# Patient Record
Sex: Male | Born: 2000 | Race: White | Hispanic: No | Marital: Single | Smoking: Never smoker
Health system: Southern US, Community
[De-identification: ages and names within clinical notes are randomized; demographics above are authoritative.]

## PROBLEM LIST (undated history)

## (undated) DIAGNOSIS — T7840XA Allergy, unspecified, initial encounter: Secondary | ICD-10-CM

## (undated) DIAGNOSIS — F419 Anxiety disorder, unspecified: Secondary | ICD-10-CM

## (undated) DIAGNOSIS — Z8659 Personal history of other mental and behavioral disorders: Secondary | ICD-10-CM

## (undated) DIAGNOSIS — F513 Sleepwalking [somnambulism]: Secondary | ICD-10-CM

## (undated) HISTORY — DX: Personal history of other mental and behavioral disorders: Z86.59

## (undated) HISTORY — DX: Allergy, unspecified, initial encounter: T78.40XA

## (undated) HISTORY — DX: Anxiety disorder, unspecified: F41.9

## (undated) HISTORY — PX: NO PAST SURGERIES: SHX2092

## (undated) HISTORY — DX: Sleepwalking (somnambulism): F51.3

---
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
## 2018-09-27 IMAGING — CR DG ABDOMEN 1V
1 series · 1 of 1 positions shown · non-contrast
Comparison: None.

CLINICAL DATA: Poor weight gain.  Underweight.

EXAM:
ABDOMEN - 1 VIEW

[t abdomen supine]
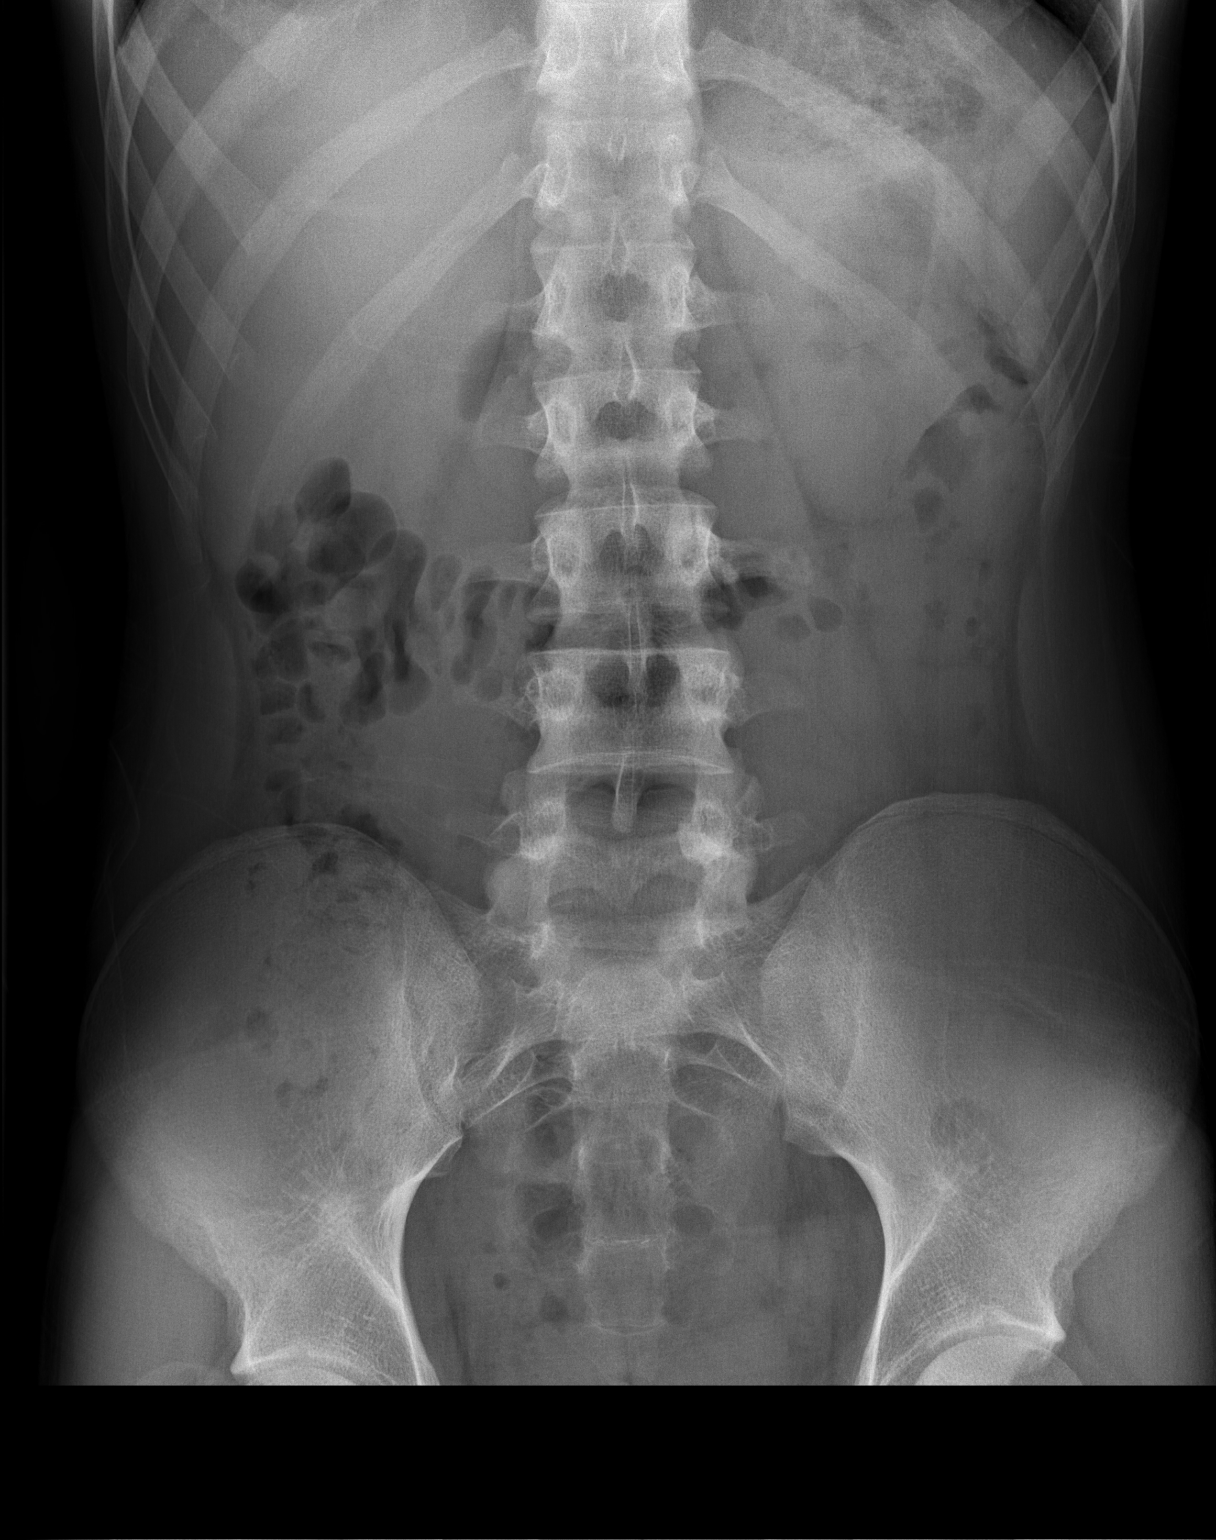

[1 of 1 positions shown; findings below may reference images not displayed]

FINDINGS: Abdominal film is unremarkable and demonstrates a normal bowel gas
pattern without evidence of dilated bowel loops, abnormal fecal
retention or abnormal appearing bowel. No abnormal calcifications or
bony abnormalities.
IMPRESSION: Normal abdominal film.

## 2018-11-07 DIAGNOSIS — Z03818 Encounter for observation for suspected exposure to other biological agents ruled out: Secondary | ICD-10-CM | POA: Diagnosis not present

## 2018-11-08 DIAGNOSIS — U071 COVID-19: Secondary | ICD-10-CM | POA: Diagnosis not present

## 2018-11-14 ENCOUNTER — Ambulatory Visit (INDEPENDENT_AMBULATORY_CARE_PROVIDER_SITE_OTHER): Payer: BC Managed Care – PPO | Admitting: Physician Assistant

## 2018-11-14 ENCOUNTER — Other Ambulatory Visit: Payer: Self-pay

## 2018-11-14 ENCOUNTER — Other Ambulatory Visit (HOSPITAL_COMMUNITY)
Admission: RE | Admit: 2018-11-14 | Discharge: 2018-11-14 | Disposition: A | Discharge status: Home / Self Care | Payer: BC Managed Care – PPO | Source: Ambulatory Visit | Attending: Physician Assistant | Admitting: Physician Assistant

## 2018-11-14 ENCOUNTER — Encounter: Payer: Self-pay | Admitting: Physician Assistant

## 2018-11-14 VITALS — BP 90/60 | HR 83 | Temp 99.8°F | Resp 16 | Ht 68.0 in | Wt 114.0 lb

## 2018-11-14 DIAGNOSIS — Z0001 Encounter for general adult medical examination with abnormal findings: Secondary | ICD-10-CM

## 2018-11-14 DIAGNOSIS — F17290 Nicotine dependence, other tobacco product, uncomplicated: Secondary | ICD-10-CM

## 2018-11-14 DIAGNOSIS — Z113 Encounter for screening for infections with a predominantly sexual mode of transmission: Secondary | ICD-10-CM | POA: Diagnosis not present

## 2018-11-14 DIAGNOSIS — N529 Male erectile dysfunction, unspecified: Secondary | ICD-10-CM

## 2018-11-14 DIAGNOSIS — Z0000 Encounter for general adult medical examination without abnormal findings: Secondary | ICD-10-CM

## 2018-11-14 LAB — POCT URINALYSIS DIPSTICK
Bilirubin, UA: NEGATIVE
Blood, UA: NEGATIVE
Glucose, UA: NEGATIVE
Ketones, UA: NEGATIVE
Leukocytes, UA: NEGATIVE
Nitrite, UA: NEGATIVE
Protein, UA: NEGATIVE
Spec Grav, UA: 1.025 (ref 1.010–1.025)
Urobilinogen, UA: 0.2 E.U./dL
pH, UA: 6 (ref 5.0–8.0)

## 2018-11-14 MED ORDER — NICOTINE 21 MG/24HR TD PT24: 21.0000 mg | MEDICATED_PATCH | Freq: Every day | TRANSDERMAL | 0 refills | Status: DC

## 2018-11-14 MED ORDER — NICOTINE 21 MG/24HR TD PT24
21.0000 mg | MEDICATED_PATCH | Freq: Every day | TRANSDERMAL | 0 refills | Status: DC
Start: 2018-11-14 — End: 2018-11-14

## 2018-11-14 NOTE — Patient Instructions (Signed)
Please schedule an appointment to return for fasting labs. Your urine dip looks good today. Will call you with the rest of results.  Start the Nicoderm patch I have sent in for the next 2 weeks. Call me in 1.5 weeks to let me know how this is going so we can determine next steps.  For the back, consider in addition to stretching, applying some icy hot or Aspercreme to the area prior to golfing to relax the muscles more and help prevent spasms. Let me know if things are not improving.     Preventive Care 9-84 Years Old, Male Preventive care refers to lifestyle choices and visits with your health care provider that can promote health and wellness. At this stage in your life, you may start seeing a primary care physician instead of a pediatrician. Your health care is now your responsibility. Preventive care for young adults includes:  A yearly physical exam. This is also called an annual wellness visit.  Regular dental and eye exams.  Immunizations.  Screening for certain conditions.  Healthy lifestyle choices, such as diet and exercise. What can I expect for my preventive care visit? Physical exam Your health care provider may check:  Height and weight. These may be used to calculate body mass index (BMI), which is a measurement that tells if you are at a healthy weight.  Heart rate and blood pressure.  Body temperature. Counseling Your health care provider may ask you questions about:  Past medical problems and family medical history.  Alcohol, tobacco, and drug use.  Home and relationship well-being.  Access to firearms.  Emotional well-being.  Diet, exercise, and sleep habits.  Sexual activity and sexual health. What immunizations do I need?  Influenza (flu) vaccine  This is recommended every year. Tetanus, diphtheria, and pertussis (Tdap) vaccine  You may need a Td booster every 10 years. Varicella (chickenpox) vaccine  You may need this vaccine if you have  not already been vaccinated. Human papillomavirus (HPV) vaccine  If recommended by your health care provider, you may need three doses over 6 months. Measles, mumps, and rubella (MMR) vaccine  You may need at least one dose of MMR. You may also need a second dose. Meningococcal conjugate (MenACWY) vaccine  One dose is recommended if you are 55-92 years old and a Market researcher living in a residence hall, or if you have one of several medical conditions. You may also need additional booster doses. Pneumococcal conjugate (PCV13) vaccine  You may need this if you have certain conditions and were not previously vaccinated. Pneumococcal polysaccharide (PPSV23) vaccine  You may need one or two doses if you smoke cigarettes or if you have certain conditions. Hepatitis A vaccine  You may need this if you have certain conditions or if you travel or work in places where you may be exposed to hepatitis A. Hepatitis B vaccine  You may need this if you have certain conditions or if you travel or work in places where you may be exposed to hepatitis B. Haemophilus influenzae type b (Hib) vaccine  You may need this if you have certain risk factors. You may receive vaccines as individual doses or as more than one vaccine together in one shot (combination vaccines). Talk with your health care provider about the risks and benefits of combination vaccines. What tests do I need? Blood tests  Lipid and cholesterol levels. These may be checked every 5 years starting at age 65.  Hepatitis C test.  Hepatitis  B test. Screening  Genital exam to check for testicular cancer or hernias.  Sexually transmitted disease (STD) testing, if you are at risk. Other tests  Tuberculosis skin test.  Vision and hearing tests.  Skin exam. Follow these instructions at home: Eating and drinking   Eat a diet that includes fresh fruits and vegetables, whole grains, lean protein, and low-fat dairy  products.  Drink enough fluid to keep your urine pale yellow.  Do not drink alcohol if: ? Your health care provider tells you not to drink. ? You are under the legal drinking age. In the [place] the legal drinking age is 59.  If you drink alcohol: ? Limit how much you have to 0-2 drinks a day. ? Be aware of how much alcohol is in your drink. In the [place] one drink equals one 12 oz bottle of beer (355 mL), one 5 oz glass of wine (148 mL), or one 1 oz glass of hard liquor (44 mL). Lifestyle  Take daily care of your teeth and gums.  Stay active. Exercise at least 30 minutes 5 or more days of the week.  Do not use any products that contain nicotine or tobacco, such as cigarettes, e-cigarettes, and chewing tobacco. If you need help quitting, ask your health care provider.  Do not use drugs.  If you are sexually active, practice safe sex. Use a condom or other form of protection to prevent STIs (sexually transmitted infections).  Find healthy ways to cope with stress, such as: ? Meditation, yoga, or listening to music. ? Journaling. ? Talking to a trusted person. ? Spending time with friends and family. Safety  Always wear your seat belt while driving or riding in a vehicle.  Do not drive if you have been drinking alcohol.  Do not ride with someone who has been drinking.  Do not drive when you are tired or distracted.  Do not text while driving.  Wear a helmet and other protective equipment during sports activities.  If you have firearms in your house, make sure you follow all gun safety procedures.  Seek help if you have been bullied, physically abused, or sexually abused.  Use the Internet responsibly to avoid dangers such as online bullying and online sex predators. What's next?  Go to your health care provider once a year for a well check visit.  Ask your health care provider how often you should have your eyes and teeth checked.  Stay up to date on all vaccines.  This information is not intended to replace advice given to you by your health care provider. Make sure you discuss any questions you have with your health care provider. Document Released: 08/08/2015 Document Revised: 03/17/2018 Document Reviewed: 03/17/2018 Elsevier Patient Education  2020 [employer].

## 2018-11-14 NOTE — Progress Notes (Signed)
Patient presents to clinic today for annual exam.  Patient is fasting for labs.  Acute Concerns: Patient requests routine STD screening today.  Is sexually active with 1 male partner (girlfriend).  Notes consistent use of condoms.  Denies any symptoms or concern.  States they both decided they should have a routine screening since that never had before.  Patient endorses a couple episodes of erectile dysfunction over the past few months.  Each episode has happened during a sexual encounter with girlfriend.  Denies painful erection or dyspareunia.  Denies change in skin of penis or abnormal curvature with erection.  States he has no issue with morning erections.  Is not sure if it is "in his head".  Denies urinary urgency, frequency, hesitancy.  Health Maintenance: Immunizations -- UTD.  Past Medical History:  Diagnosis Date  . Allergy    Seasonal  . Anxiety    with test-taking only    Past Surgical History:  Procedure Laterality Date  . NO PAST SURGERIES      No current outpatient medications on file prior to visit.   No current facility-administered medications on file prior to visit.     No Known Allergies  Family History  Problem Relation Age of Onset  . Healthy Mother        Living  . Hypertension Paternal Grandfather   . Hyperlipidemia Paternal Grandfather   . Diabetes Father        Living  . Hyperlipidemia Father   . Asthma Paternal Aunt   . Healthy Sister        x2    Social History   Socioeconomic History  . Marital status: Single    Spouse name: Not on file  . Number of children: Not on file  . Years of education: Not on file  . Highest education level: Not on file  Occupational History  . Not on file  Social Needs  . Financial resource strain: Not on file  . Food insecurity    Worry: Not on file    Inability: Not on file  . Transportation needs    Medical: Not on file    Non-medical: Not on file  Tobacco Use  . Smoking status: Never  Smoker  . Smokeless tobacco: Never Used  Substance and Sexual Activity  . Alcohol use: No    Alcohol/week: 0.0 standard drinks  . Drug use: No  . Sexual activity: Yes    Birth control/protection: Condom  Lifestyle  . Physical activity    Days per week: Not on file    Minutes per session: Not on file  . Stress: Not on file  Relationships  . Social Musicianconnections    Talks on phone: Not on file    Gets together: Not on file    Attends religious service: Not on file    Active member of club or organization: Not on file    Attends meetings of clubs or organizations: Not on file    Relationship status: Not on file  . Intimate partner violence    Fear of current or ex partner: Not on file    Emotionally abused: Not on file    Physically abused: Not on file    Forced sexual activity: Not on file  Other Topics Concern  . 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   Plays Golf.    Review of Systems  Constitutional: Negative for  fever and weight loss.  HENT: Negative for ear discharge, ear pain, hearing loss and tinnitus.   Eyes: Negative for blurred vision, double vision, photophobia and pain.  Respiratory: Negative for cough and shortness of breath.   Cardiovascular: Negative for chest pain and palpitations.  Gastrointestinal: Negative for abdominal pain, blood in stool, constipation, diarrhea, heartburn, melena, nausea and vomiting.  Genitourinary: Negative for dysuria, flank pain, frequency, hematuria and urgency.       Occasional ED when trying to have intercourse  Musculoskeletal: Negative for falls.  Neurological: Negative for dizziness, loss of consciousness and headaches.  Endo/Heme/Allergies: Negative for environmental allergies.  Psychiatric/Behavioral: Negative for depression, hallucinations, substance abuse and suicidal ideas. The patient is not nervous/anxious and does not have insomnia.    BP 90/60   Pulse 83    Temp 99.8 F (37.7 C) (Skin)   Resp 16   Ht 5\' 8"  (1.727 m)   Wt 114 lb (51.7 kg)   SpO2 99%   BMI 17.33 kg/m   Physical Exam Vitals signs reviewed.  Constitutional:      General: He is not in acute distress.    Appearance: Normal appearance. He is well-developed. He is not diaphoretic.  HENT:     Head: Normocephalic and atraumatic.     Right Ear: Tympanic membrane, ear canal and external ear normal.     Left Ear: Tympanic membrane, ear canal and external ear normal.     Nose: Nose normal.     Mouth/Throat:     Pharynx: No posterior oropharyngeal erythema.  Eyes:     Conjunctiva/sclera: Conjunctivae normal.     Pupils: Pupils are equal, round, and reactive to light.  Neck:     Musculoskeletal: Neck supple.     Thyroid: No thyromegaly.  Cardiovascular:     Rate and Rhythm: Normal rate and regular rhythm.     Heart sounds: Normal heart sounds.  Pulmonary:     Effort: Pulmonary effort is normal. No respiratory distress.     Breath sounds: Normal breath sounds. No wheezing or rales.  Chest:     Chest wall: No tenderness.  Abdominal:     General: Bowel sounds are normal. There is no distension.     Palpations: Abdomen is soft. There is no mass.     Tenderness: There is no abdominal tenderness. There is no guarding or rebound.     Hernia: There is no hernia in the left inguinal area or right inguinal area.  Genitourinary:    Penis: Normal and circumcised.      Scrotum/Testes: Normal. Cremasteric reflex is present.        Right: Mass or tenderness not present.        Left: Mass or tenderness not present.     Epididymis:     Right: Normal.     Left: Normal.  Lymphadenopathy:     Cervical: No cervical adenopathy.     Lower Body: No right inguinal adenopathy. No left inguinal adenopathy.  Skin:    General: Skin is warm and dry.     Findings: No rash.  Neurological:     Mental Status: He is alert and oriented to person, place, and time.     Cranial Nerves: No cranial  nerve deficit.    Assessment/Plan: 1. Visit for preventive health examination Depression screen negative. Health Maintenance reviewed. Preventive schedule discussed and handout given in AVS. Will obtain fasting labs today.  - CBC with Differential/Platelet; Future - Comprehensive metabolic panel; Future -  Lipid panel; Future - TSH; Future  2. Routine screening for STI (sexually transmitted infection) Asymptomatic. 1 partner - male - monogamous - consistent condom use. Exam unremarkable. Will obtain routine panel today.  - Urine cytology ancillary only([place]) - Acute Hep Panel & Hep B Surface Ab; Future - HIV Antibody (routine testing w rflx); Future - HSV(herpes smplx)abs-1+2(IgG+IgM)-bld; Future - RPR; Future  3. Other tobacco product nicotine dependence, uncomplicated Vaping for the past year.  Is ready and willing to quit.  Has tried to stop cold [place] before but noted significant anxiety and irritability.  After discussion of options we will start NicoDerm patches.  We will start him at highest dose.  Follow-up in 2 weeks via phone to make further adjustments..  - nicotine (NICODERM CQ) 21 mg/24hr patch; Place 1 patch (21 mg total) onto the skin daily.  Dispense: 14 patch; Refill: 0  4. Erectile dysfunction, unspecified erectile dysfunction type Seems more psychogenic than anything else.  Examination unremarkable.  Urine dip unremarkable.  No issue with morning erections.  Will check labs including routine STI screening today.  Discussed some stress related exercises.  He is to work on this and follow-up if things are not improving. - POCT urinalysis dipstick   Piedad ClimesWilliam Cody Renley Banwart, PA-C

## 2018-11-15 LAB — URINE CYTOLOGY ANCILLARY ONLY
Chlamydia: NEGATIVE
Neisseria Gonorrhea: NEGATIVE
Trichomonas: NEGATIVE

## 2018-11-23 ENCOUNTER — Ambulatory Visit (INDEPENDENT_AMBULATORY_CARE_PROVIDER_SITE_OTHER): Payer: BC Managed Care – PPO

## 2018-11-23 ENCOUNTER — Other Ambulatory Visit: Payer: Self-pay

## 2018-11-23 DIAGNOSIS — Z113 Encounter for screening for infections with a predominantly sexual mode of transmission: Secondary | ICD-10-CM

## 2018-11-23 DIAGNOSIS — Z0000 Encounter for general adult medical examination without abnormal findings: Secondary | ICD-10-CM

## 2018-11-23 LAB — LIPID PANEL
Cholesterol: 134 mg/dL (ref 0–200)
HDL: 37.4 mg/dL — ABNORMAL LOW (ref >39.00)
LDL Cholesterol: 74 mg/dL (ref 0–99)
NonHDL: 96.12
Total CHOL/HDL Ratio: 4
Triglycerides: 109 mg/dL (ref 0.0–149.0)
VLDL: 21.8 mg/dL (ref 0.0–40.0)

## 2018-11-23 LAB — CBC WITH DIFFERENTIAL/PLATELET
Basophils Absolute: 0 10*3/uL (ref 0.0–0.1)
Basophils Relative: 0.4 % (ref 0.0–3.0)
Eosinophils Absolute: 0.1 10*3/uL (ref 0.0–0.7)
Eosinophils Relative: 1.5 % (ref 0.0–5.0)
HCT: 40.8 % (ref 36.0–49.0)
Hemoglobin: 14.1 g/dL (ref 12.0–16.0)
Lymphocytes Relative: 38.3 % (ref 24.0–48.0)
Lymphs Abs: 2.4 10*3/uL (ref 0.7–4.0)
MCHC: 34.6 g/dL (ref 31.0–37.0)
MCV: 86.7 fl (ref 78.0–98.0)
Monocytes Absolute: 0.5 10*3/uL (ref 0.1–1.0)
Monocytes Relative: 7.5 % (ref 3.0–12.0)
Neutro Abs: 3.2 10*3/uL (ref 1.4–7.7)
Neutrophils Relative %: 52.3 % (ref 43.0–71.0)
Platelets: 244 10*3/uL (ref 150.0–575.0)
RBC: 4.7 Mil/uL (ref 3.80–5.70)
RDW: 13.3 % (ref 11.4–15.5)
WBC: 6.2 10*3/uL (ref 4.5–13.5)

## 2018-11-23 LAB — COMPREHENSIVE METABOLIC PANEL
ALT: 11 U/L (ref 0–53)
AST: 13 U/L (ref 0–37)
Albumin: 5 g/dL (ref 3.5–5.2)
Alkaline Phosphatase: 102 U/L (ref 52–171)
BUN: 11 mg/dL (ref 6–23)
CO2: 26 mEq/L (ref 19–32)
Calcium: 9.7 mg/dL (ref 8.4–10.5)
Chloride: 104 mEq/L (ref 96–112)
Creatinine, Ser: 0.95 mg/dL (ref 0.40–1.50)
GFR: 101.43 mL/min (ref >60.00)
Glucose, Bld: 80 mg/dL (ref 70–99)
Potassium: 3.7 mEq/L (ref 3.5–5.1)
Sodium: 141 mEq/L (ref 135–145)
Total Bilirubin: 0.5 mg/dL (ref 0.2–1.2)
Total Protein: 7.4 g/dL (ref 6.0–8.3)

## 2018-11-23 LAB — TSH: TSH: 2.59 u[IU]/mL (ref 0.40–5.00)

## 2018-11-24 LAB — ACUTE HEP PANEL AND HEP B SURFACE AB
HEPATITIS C ANTIBODY REFILL$(REFL): NONREACTIVE
Hep A IgM: NONREACTIVE
Hep B C IgM: NONREACTIVE
Hepatitis B Surface Ag: NONREACTIVE
SIGNAL TO CUT-OFF: 0.01 (ref <1.00)

## 2018-11-24 LAB — HIV ANTIBODY (ROUTINE TESTING W REFLEX): HIV 1&2 Ab, 4th Generation: NONREACTIVE

## 2018-11-24 LAB — REFLEX TIQ

## 2018-11-24 LAB — RPR: RPR Ser Ql: NONREACTIVE

## 2018-11-25 LAB — HSV(HERPES SMPLX)ABS-I+II(IGG+IGM)-BLD
HSV 1 Glycoprotein G Ab, IgG: <0.91 index (ref 0.00–0.90)
HSV 2 IgG, Type Spec: <0.91 index (ref 0.00–0.90)
HSVI/II Comb IgM: <0.91 Ratio (ref 0.00–0.90)

## 2019-04-05 DIAGNOSIS — Z20828 Contact with and (suspected) exposure to other viral communicable diseases: Secondary | ICD-10-CM | POA: Diagnosis not present

## 2019-04-11 DIAGNOSIS — Z20828 Contact with and (suspected) exposure to other viral communicable diseases: Secondary | ICD-10-CM | POA: Diagnosis not present

## 2019-10-31 ENCOUNTER — Telehealth: Payer: Self-pay | Admitting: Physician Assistant

## 2019-10-31 NOTE — Telephone Encounter (Signed)
Received dorm (housing) form from patient.  Put in Cody's bin up front

## 2019-10-31 NOTE — Telephone Encounter (Signed)
Reviewed forms. What housing accomodation is he requesting? I have to show medical necessity for the accomodation.

## 2019-10-31 NOTE — Telephone Encounter (Signed)
Housing Accomodation forms in your folder  LOV: 11/14/18 CPE Next OV: 11/20/19 CPE

## 2019-11-01 NOTE — Telephone Encounter (Signed)
Spoke with patient about housing accomodation forms. He states that he sleep talks and sleep walks. His freshman year he was standing over his then roommate which scared him. 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 He does not want a new roommate due to his sleep walking and talking.  Patient states he will call back with fax number for [place] to fax to.

## 2019-11-01 NOTE — Telephone Encounter (Signed)
Pt called in with the fax # the fax # is [phone]

## 2019-11-02 NOTE — Telephone Encounter (Signed)
I do not see in his chart where this has been discussed at prior visits or added to his problem list. This will need to occur before forms can be submitted. I know he has a CPE coming up. Can do at that time or if he needs done sooner, can always do a video visit to discuss this issue prior to CPE.

## 2019-11-02 NOTE — Telephone Encounter (Signed)
Patient called and said that he needs the form filled out because of his learning disability - ADD/ADHD - he said that you would have notes on this condition.

## 2019-11-03 NOTE — Telephone Encounter (Signed)
Left detailed message on VM of needing an video visit to discuss sleep walking for the reason for the housing accommodations for school. For his ADD, does he need another form completed. ADD would not classify to be a reason to have housing accommodations. But can discuss at visit sooner than CPE appointment

## 2019-11-20 ENCOUNTER — Encounter: Payer: Self-pay | Admitting: Physician Assistant

## 2019-11-20 ENCOUNTER — Ambulatory Visit (INDEPENDENT_AMBULATORY_CARE_PROVIDER_SITE_OTHER): Payer: BC Managed Care – PPO | Admitting: Physician Assistant

## 2019-11-20 ENCOUNTER — Other Ambulatory Visit: Payer: Self-pay

## 2019-11-20 VITALS — BP 100/78 | HR 77 | Temp 98.7°F | Resp 16 | Ht 68.0 in | Wt 118.0 lb

## 2019-11-20 DIAGNOSIS — Z0000 Encounter for general adult medical examination without abnormal findings: Secondary | ICD-10-CM | POA: Diagnosis not present

## 2019-11-20 DIAGNOSIS — F513 Sleepwalking [somnambulism]: Secondary | ICD-10-CM

## 2019-11-20 DIAGNOSIS — S46811A Strain of other muscles, fascia and tendons at shoulder and upper arm level, right arm, initial encounter: Secondary | ICD-10-CM

## 2019-11-20 LAB — COMPREHENSIVE METABOLIC PANEL
ALT: 10 U/L (ref 0–53)
AST: 15 U/L (ref 0–37)
Albumin: 4.9 g/dL (ref 3.5–5.2)
Alkaline Phosphatase: 93 U/L (ref 39–117)
BUN: 12 mg/dL (ref 6–23)
CO2: 28 mEq/L (ref 19–32)
Calcium: 9.8 mg/dL (ref 8.4–10.5)
Chloride: 107 mEq/L (ref 96–112)
Creatinine, Ser: 1.04 mg/dL (ref 0.40–1.50)
GFR: 90.46 mL/min (ref >60.00)
Glucose, Bld: 82 mg/dL (ref 70–99)
Potassium: 4.5 mEq/L (ref 3.5–5.1)
Sodium: 142 mEq/L (ref 135–145)
Total Bilirubin: 0.6 mg/dL (ref 0.2–1.2)
Total Protein: 7.2 g/dL (ref 6.0–8.3)

## 2019-11-20 LAB — LIPID PANEL
Cholesterol: 119 mg/dL (ref 0–200)
HDL: 36.7 mg/dL — ABNORMAL LOW (ref >39.00)
LDL Cholesterol: 70 mg/dL (ref 0–99)
NonHDL: 82.04
Total CHOL/HDL Ratio: 3
Triglycerides: 59 mg/dL (ref 0.0–149.0)
VLDL: 11.8 mg/dL (ref 0.0–40.0)

## 2019-11-20 MED ORDER — METHOCARBAMOL 500 MG PO TABS: 500.0000 mg | ORAL_TABLET | Freq: Every evening | ORAL | 0 refills | Status: DC | PRN

## 2019-11-20 NOTE — Patient Instructions (Signed)
Please go to the lab for blood work.   Our office will call you with your results unless you have chosen to receive results via MyChart.  If your blood work is normal we will follow-up each year for physicals and as scheduled for chronic medical problems.  If anything is abnormal we will treat accordingly and get you in for a follow-up.  Avoid heavy lifting and overexertion. Take the Robaxin in the evening as directed. Can alternate tylenol and ibuprofen as needed. Apply heating pad to the neck for 10-15 minutes a few times per day. Symptoms should continue resolving over the next week or so. If not, please let me know. .   Preventive Care 33-78 Years Old, Male Preventive care refers to lifestyle choices and visits with your health care provider that can promote health and wellness. At this stage in your life, you may start seeing a primary care physician instead of a pediatrician. Your health care is now your responsibility. Preventive care for young adults includes:  A yearly physical exam. This is also called an annual wellness visit.  Regular dental and eye exams.  Immunizations.  Screening for certain conditions.  Healthy lifestyle choices, such as diet and exercise. What can I expect for my preventive care visit? Physical exam Your health care provider may check:  Height and weight. These may be used to calculate body mass index (BMI), which is a measurement that tells if you are at a healthy weight.  Heart rate and blood pressure.  Body temperature. Counseling Your health care provider may ask you questions about:  Past medical problems and family medical history.  Alcohol, tobacco, and drug use.  Home and relationship well-being.  Access to firearms.  Emotional well-being.  Diet, exercise, and sleep habits.  Sexual activity and sexual health. What immunizations do I need?  Influenza (flu) vaccine  This is recommended every year. Tetanus, diphtheria,  and pertussis (Tdap) vaccine  You may need a Td booster every 10 years. Varicella (chickenpox) vaccine  You may need this vaccine if you have not already been vaccinated. Human papillomavirus (HPV) vaccine  If recommended by your health care provider, you may need three doses over 6 months. Measles, mumps, and rubella (MMR) vaccine  You may need at least one dose of MMR. You may also need a second dose. Meningococcal conjugate (MenACWY) vaccine  One dose is recommended if you are 45-27 years old and a Market researcher living in a residence hall, or if you have one of several medical conditions. You may also need additional booster doses. Pneumococcal conjugate (PCV13) vaccine  You may need this if you have certain conditions and were not previously vaccinated. Pneumococcal polysaccharide (PPSV23) vaccine  You may need one or two doses if you smoke cigarettes or if you have certain conditions. Hepatitis A vaccine  You may need this if you have certain conditions or if you travel or work in places where you may be exposed to hepatitis A. Hepatitis B vaccine  You may need this if you have certain conditions or if you travel or work in places where you may be exposed to hepatitis B. Haemophilus influenzae type b (Hib) vaccine  You may need this if you have certain risk factors. You may receive vaccines as individual doses or as more than one vaccine together in one shot (combination vaccines). Talk with your health care provider about the risks and benefits of combination vaccines. What tests do I need? Blood tests  Lipid  and cholesterol levels. These may be checked every 5 years starting at age 35.  Hepatitis C test.  Hepatitis B test. Screening  Genital exam to check for testicular cancer or hernias.  Sexually transmitted disease (STD) testing, if you are at risk. Other tests  Tuberculosis skin test.  Vision and hearing tests.  Skin exam. Follow these  instructions at home: Eating and drinking   Eat a diet that includes fresh fruits and vegetables, whole grains, lean protein, and low-fat dairy products.  Drink enough fluid to keep your urine pale yellow.  Do not drink alcohol if: ? Your health care provider tells you not to drink. ? You are under the legal drinking age. In the [place] the legal drinking age is 46.  If you drink alcohol: ? Limit how much you have to 0-2 drinks a day. ? Be aware of how much alcohol is in your drink. In the [place] one drink equals one 12 oz bottle of beer (355 mL), one 5 oz glass of wine (148 mL), or one 1 oz glass of hard liquor (44 mL). Lifestyle  Take daily care of your teeth and gums.  Stay active. Exercise at least 30 minutes 5 or more days of the week.  Do not use any products that contain nicotine or tobacco, such as cigarettes, e-cigarettes, and chewing tobacco. If you need help quitting, ask your health care provider.  Do not use drugs.  If you are sexually active, practice safe sex. Use a condom or other form of protection to prevent STIs (sexually transmitted infections).  Find healthy ways to cope with stress, such as: ? Meditation, yoga, or listening to music. ? Journaling. ? Talking to a trusted person. ? Spending time with friends and family. Safety  Always wear your seat belt while driving or riding in a vehicle.  Do not drive if you have been drinking alcohol.  Do not ride with someone who has been drinking.  Do not drive when you are tired or distracted.  Do not text while driving.  Wear a helmet and other protective equipment during sports activities.  If you have firearms in your house, make sure you follow all gun safety procedures.  Seek help if you have been bullied, physically abused, or sexually abused.  Use the Internet responsibly to avoid dangers such as online bullying and online sex predators. What's next?  Go to your health care provider once a year for  a well check visit.  Ask your health care provider how often you should have your eyes and teeth checked.  Stay up to date on all vaccines. This information is not intended to replace advice given to you by your health care provider. Make sure you discuss any questions you have with your health care provider. Document Revised: 03/17/2018 Document Reviewed: 03/17/2018 Elsevier Patient Education  2020 [employer].
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
## 2019-11-22 ENCOUNTER — Encounter: Payer: Self-pay | Admitting: Physician Assistant

## 2020-09-08 DIAGNOSIS — Z20822 Contact with and (suspected) exposure to covid-19: Secondary | ICD-10-CM | POA: Diagnosis not present

## 2020-11-08 ENCOUNTER — Telehealth: Payer: Self-pay | Admitting: Family Medicine

## 2020-11-08 NOTE — Telephone Encounter (Signed)
error 

## 2020-11-21 ENCOUNTER — Encounter: Payer: BC Managed Care – PPO | Admitting: Physician Assistant

## 2021-02-11 ENCOUNTER — Encounter: Payer: BC Managed Care – PPO | Admitting: Registered Nurse

## 2021-03-04 ENCOUNTER — Encounter: Payer: BC Managed Care – PPO | Admitting: Family Medicine

## 2021-05-15 ENCOUNTER — Other Ambulatory Visit: Payer: Self-pay

## 2021-05-16 ENCOUNTER — Ambulatory Visit (INDEPENDENT_AMBULATORY_CARE_PROVIDER_SITE_OTHER): Payer: BC Managed Care – PPO | Admitting: Family Medicine

## 2021-05-16 ENCOUNTER — Encounter: Payer: Self-pay | Admitting: Family Medicine

## 2021-05-16 VITALS — BP 110/60 | HR 85 | Temp 97.6°F | Ht 68.0 in | Wt 125.6 lb

## 2021-05-16 DIAGNOSIS — R1115 Cyclical vomiting syndrome unrelated to migraine: Secondary | ICD-10-CM

## 2021-05-16 DIAGNOSIS — M23206 Derangement of unspecified meniscus due to old tear or injury, right knee: Secondary | ICD-10-CM | POA: Diagnosis not present

## 2021-05-16 DIAGNOSIS — R636 Underweight: Secondary | ICD-10-CM | POA: Diagnosis not present

## 2021-05-16 DIAGNOSIS — S83206A Unspecified tear of unspecified meniscus, current injury, right knee, initial encounter: Secondary | ICD-10-CM | POA: Insufficient documentation

## 2021-05-16 NOTE — Progress Notes (Signed)
[facility] [address] [place] [address] Dept: [phone] Dept Fax: [phone]  Transfer of Care Office Visit  Subjective:    Patient ID: Gabriel Roberts, male    DOB: 16-Sep-2000, 21 y.o..   MRN: [id number]  Chief Complaint  Patient presents with   Establish Care    [facility]- establish care.   No concerns.  Declines flu and covid vaccines.     History of Present Illness:  Patient is in today to establish care. Mr. Slane was born in [place]. He attended [employer]. 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 He is in a committed relations hip and has no children. He vapes nicotine and occasionally uses pouch tobacco. His plan is to stop this after college. He drinks alcohol on weekends. He denies drug use.  Mr. Xu has a history of a partial tear to a mensicus in the right knee. He injured this in 2018. He notes he was working on leg pressing 200 lbs when he had a sudden pop in the knee. He has been told he may need future surgery. He notes that when he walks 18 holes of cough, he gets some knee discomfort.  Mr. Crumpacker has a history of a low body weight. He notes that he had some weight loss when over training without adequate nutrition in his high school days. he has worked to get his weight back up and is pleased to see this at 125 lbs.  Past Medical History: Patient Active Problem List   Diagnosis Date Noted   Right knee meniscal tear 05/16/2021   Underweight 11/04/2016   Non-intractable cyclical vomiting with nausea 11/04/2016   Past Surgical History:  Procedure Laterality Date   NO PAST SURGERIES     Family History  Problem Relation Age of Onset   Healthy Mother        Living   Diabetes Father        Living   Hyperlipidemia Father    Healthy Sister         x2   Asthma Paternal Aunt    Cancer Maternal Grandfather    COPD Paternal Grandfather    Hypertension Paternal Grandfather    Hyperlipidemia Paternal Grandfather    Outpatient Medications Prior to Visit  Medication Sig Dispense Refill   methocarbamol (ROBAXIN) 500 MG tablet Take 1 tablet (500 mg total) by mouth at bedtime as needed for muscle spasms. 15 tablet 0   No facility-administered medications prior to visit.   No Known Allergies    Objective:   Today's Vitals   05/16/21 1439  BP: 110/60  Pulse: 85  Temp: 97.6 F (36.4 C)  TempSrc: Temporal  SpO2: 98%  Weight: 125 lb 9.6 oz (57 kg)  Height: 5\' 8"  (1.727 m)   Body mass index is 19.1 kg/m.   General: Well developed, well nourished. No acute distress. Extremities: Mild popping of the right knee joint with flexion/extension. Psych: Alert and oriented. Normal mood and affect.  Health Maintenance Due  Topic Date Due   COVID-19 Vaccine (1) Never done   HPV VACCINES (2 - Male 3-dose series) 12/02/2016   Hepatitis C Screening  Never done   TETANUS/TDAP  08/27/2020   INFLUENZA VACCINE  11/04/2020     Assessment & Plan:   1. 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 Hudnall has a torn mensicus. I agree that he may benefit from arthroscopic surgery at some point to clean up the cartilage. He will return when he is ready for this.  2. Non-intractable cyclical vomiting with nausea 3. Underweight BMI is in the lower range of normal. He has learned to adjust his diet to manage with the vomiting issues. We will monitor this for now.  Loyola Mast, MD

## 2021-05-19 ENCOUNTER — Telehealth: Payer: Self-pay | Admitting: Family Medicine

## 2021-05-19 NOTE — Telephone Encounter (Signed)
Patient notified VIA phone. Dm/cma  

## 2021-05-19 NOTE — Telephone Encounter (Signed)
Pt was seen on on 05/16/21. He is wanting a note for school excusing him for the day. He is wanting this put into his mychart. If any questions you can reach him at [phone].

## 2021-10-29 DIAGNOSIS — M25511 Pain in right shoulder: Secondary | ICD-10-CM | POA: Diagnosis not present

## 2022-03-15 DIAGNOSIS — J029 Acute pharyngitis, unspecified: Secondary | ICD-10-CM | POA: Diagnosis not present

## 2022-03-15 DIAGNOSIS — U071 COVID-19: Secondary | ICD-10-CM | POA: Diagnosis not present

## 2022-03-15 DIAGNOSIS — R509 Fever, unspecified: Secondary | ICD-10-CM | POA: Diagnosis not present

## 2022-07-10 ENCOUNTER — Telehealth: Payer: Self-pay

## 2022-07-10 ENCOUNTER — Ambulatory Visit (INDEPENDENT_AMBULATORY_CARE_PROVIDER_SITE_OTHER): Payer: BC Managed Care – PPO | Admitting: Family Medicine

## 2022-07-10 ENCOUNTER — Encounter: Payer: Self-pay | Admitting: Family Medicine

## 2022-07-10 VITALS — BP 110/68 | HR 68 | Temp 98.1°F | Ht 68.0 in | Wt 126.4 lb

## 2022-07-10 DIAGNOSIS — Z0000 Encounter for general adult medical examination without abnormal findings: Secondary | ICD-10-CM

## 2022-07-10 DIAGNOSIS — Z23 Encounter for immunization: Secondary | ICD-10-CM | POA: Diagnosis not present

## 2022-07-10 DIAGNOSIS — M2560 Stiffness of unspecified joint, not elsewhere classified: Secondary | ICD-10-CM

## 2022-07-10 DIAGNOSIS — Z1159 Encounter for screening for other viral diseases: Secondary | ICD-10-CM

## 2022-07-10 DIAGNOSIS — M791 Myalgia, unspecified site: Secondary | ICD-10-CM | POA: Diagnosis not present

## 2022-07-10 DIAGNOSIS — R768 Other specified abnormal immunological findings in serum: Secondary | ICD-10-CM

## 2022-07-10 LAB — CBC WITH DIFFERENTIAL/PLATELET
Basophils Absolute: 0 10*3/uL (ref 0.0–0.1)
Basophils Relative: 0.7 % (ref 0.0–3.0)
Eosinophils Absolute: 0.3 10*3/uL (ref 0.0–0.7)
Eosinophils Relative: 3.7 % (ref 0.0–5.0)
HCT: 43.1 % (ref 39.0–52.0)
Hemoglobin: 14.9 g/dL (ref 13.0–17.0)
Lymphocytes Relative: 30 % (ref 12.0–46.0)
Lymphs Abs: 2 10*3/uL (ref 0.7–4.0)
MCHC: 34.6 g/dL (ref 30.0–36.0)
MCV: 86.1 fl (ref 78.0–100.0)
Monocytes Absolute: 0.6 10*3/uL (ref 0.1–1.0)
Monocytes Relative: 8.4 % (ref 3.0–12.0)
Neutro Abs: 3.9 10*3/uL (ref 1.4–7.7)
Neutrophils Relative %: 57.2 % (ref 43.0–77.0)
Platelets: 312 10*3/uL (ref 150.0–400.0)
RBC: 5 Mil/uL (ref 4.22–5.81)
RDW: 12.9 % (ref 11.5–15.5)
WBC: 6.8 10*3/uL (ref 4.0–10.5)

## 2022-07-10 LAB — COMPREHENSIVE METABOLIC PANEL
ALT: 9 U/L (ref 0–53)
AST: 12 U/L (ref 0–37)
Albumin: 5 g/dL (ref 3.5–5.2)
Alkaline Phosphatase: 109 U/L (ref 39–117)
BUN: 14 mg/dL (ref 6–23)
CO2: 27 mEq/L (ref 19–32)
Calcium: 9.9 mg/dL (ref 8.4–10.5)
Chloride: 104 mEq/L (ref 96–112)
Creatinine, Ser: 1.1 mg/dL (ref 0.40–1.50)
GFR: 94.77 mL/min (ref >60.00)
Glucose, Bld: 98 mg/dL (ref 70–99)
Potassium: 4.4 mEq/L (ref 3.5–5.1)
Sodium: 140 mEq/L (ref 135–145)
Total Bilirubin: 0.5 mg/dL (ref 0.2–1.2)
Total Protein: 7.4 g/dL (ref 6.0–8.3)

## 2022-07-10 LAB — SEDIMENTATION RATE: Sed Rate: 4 mm/hr (ref 0–15)

## 2022-07-10 LAB — TSH: TSH: 1.33 u[IU]/mL (ref 0.35–5.50)

## 2022-07-10 LAB — C-REACTIVE PROTEIN: CRP: <1 mg/dL (ref 0.5–20.0)

## 2022-07-10 LAB — VITAMIN D 25 HYDROXY (VIT D DEFICIENCY, FRACTURES): VITD: 25.88 ng/mL — ABNORMAL LOW (ref 30.00–100.00)

## 2022-07-10 NOTE — Progress Notes (Signed)
[facility] [address] [place] [address] Dept: [phone] Dept Fax: [phone]  Annual Physical Visit  Subjective:    Patient ID: Gabriel Roberts, male    DOB: 21-Jan-2001, 22 y.o..   MRN: [id number]  Chief Complaint  Patient presents with   Annual Exam    CPE/labs.  Fasting today.  No concerns.    History of Present Illness:  Patient is in today for an annual physical/preventative visit.  Review of Systems  Constitutional:  Negative for chills, diaphoresis, fever, malaise/fatigue and weight loss.  HENT:  Negative for congestion, ear pain, hearing loss, sinus pain, sore throat and tinnitus.   Eyes:  Positive for blurred vision. Negative for pain, discharge and redness.       Notes vision is not as sharp as it was in the past. He has not been to have an eye exam as of yet.  Respiratory:  Negative for cough, shortness of breath and wheezing.   Cardiovascular:  Negative for chest pain and palpitations.  Gastrointestinal:  Negative for abdominal pain, constipation, diarrhea, heartburn, nausea and vomiting.  Musculoskeletal:  Positive for joint pain and myalgias. Negative for back pain.       Notes an issue with diffuse arthralgias/myalgias and joint stiffness, esp. in the mornings. He states this has been present for months. He works as a Journalist, newspaper. Although e is active, he is not at the same level as when he was competing. No family history of rheumatic disease, He does recall a tick bite in college, when playing a golf competition in [place]. He does not recall a spreading rash from this.  Skin:  Negative for itching and rash.  Psychiatric/Behavioral:  Negative for depression. The patient is not nervous/anxious.    Past Medical History: Patient Active Problem List   Diagnosis Date Noted   Right knee meniscal tear 05/16/2021   Underweight 11/04/2016   Non-intractable cyclical vomiting with nausea 11/04/2016   Past  Surgical History:  Procedure Laterality Date   NO PAST SURGERIES     Family History  Problem Relation Age of Onset   Healthy Mother        Living   Diabetes Father        Living   Hyperlipidemia Father    Healthy Sister        x2   Asthma Paternal Aunt    Cancer Maternal Grandfather    COPD Paternal Grandfather    Hypertension Paternal Grandfather    Hyperlipidemia Paternal Grandfather    Outpatient Medications Prior to Visit  Medication Sig Dispense Refill   HYDROcodone-acetaminophen (NORCO) 7.5-325 MG tablet Take 1 tablet by mouth 2 (two) times daily as needed. (Patient not taking: Reported on 07/10/2022)     ketorolac (TORADOL) 10 MG tablet Take 10 mg by mouth every 6 (six) hours as needed. (Patient not taking: Reported on 07/10/2022)     No facility-administered medications prior to visit.   No Known Allergies Objective:   Today's Vitals   07/10/22 0858  BP: 110/68  Pulse: 68  Temp: 98.1 F (36.7 C)  TempSrc: Temporal  SpO2: 99%  Weight: 126 lb 6.4 oz (57.3 kg)  Height: 5\' 8"  (1.727 m)   Body mass index is 19.22 kg/m.   General: Well developed, well nourished. No acute distress. HEENT: Normocephalic, non-traumatic. PERRL, EOMI. Conjunctiva clear. External ears normal. EAC and TMs   normal bilaterally. Nose clear without congestion or rhinorrhea. Mucous membranes moist. Oropharynx clear.  Good   dentition. Neck: Supple. No lymphadenopathy. No thyromegaly. Lungs: Clear to auscultation bilaterally. No wheezing, rales or rhonchi. CV: RRR without murmurs or rubs. Pulses 2+ bilaterally. Abdomen: Soft, non-tender. Bowel sounds positive, normal pitch and frequency. No hepatosplenomegaly. No   rebound or guarding. Back: Straight. No CVA tenderness bilaterally. Extremities: Full ROM. No joint swelling or tenderness. No edema noted. Skin: Warm and dry. No rashes. Neuro: CN II-XII intact. Normal sensation and DTR bilaterally. Psych: Alert and oriented. Normal mood and  affect.  Health Maintenance Due  Topic Date Due   HPV VACCINES (2 - Male 3-dose series) 12/02/2016   Hepatitis C Screening  Never done   DTaP/Tdap/Td (7 - Td or Tdap) 08/27/2020     Assessment & Plan:  1. Annual physical exam Reviewed recommended screenigns and immunizations.   2. Morning stiffness of joints 3. Myalgia No exam findings of an acute inflammatory arthritis. I will order labs to look for evidence of systemic disease or autoimmune causes of his symptoms. I will also check a Lyme titer in light of his history of tick bite. If work-up is benign, I would still consider referral to rheumatology for an assessment.  - CBC with Differential/Platelet - Comprehensive metabolic panel - TSH - VITAMIN D 25 Hydroxy (Vit-D Deficiency, Fractures) - Sedimentation rate - C-reactive protein - ANA - Lyme Disease Serology w/Reflex - Cyclic citrul peptide antibody, IgG - CK; Future  4. Need for Td vaccine  - Td vaccine greater than or equal to 7yo preservative free IM  5. Encounter for hepatitis C screening test for low risk patient  - HCV Ab w Reflex to Quant PCR    Return for Follow-up after testing/imaging.   Loyola MastStephen M Makayla Confer, MD

## 2022-07-10 NOTE — Patient Outreach (Signed)
  Care Coordination   In Person Provider Office Visit Note   07/10/2022 Name: FARAAZ STEC MRN: [id number] DOB: 10/20/2000  Steffanie Rainwater is a 22 y.o. year old male who sees Rudd, Bertram Millard, MD for primary care. I engaged with Steffanie Rainwater in the providers office today.  What matters to the patients health and wellness today?  none    Goals Addressed             This Visit's Progress    Care Coordination Activities-No follow up required       Care Coordination Interventions: Advised patient to Annual Wellness exam. Discussed [facility] services and support. Assessed SDOH. Advised to discuss with primary care physician if services needed in the future.        SDOH assessments and interventions completed:  Yes  SDOH Interventions Today    Flowsheet Row Most Recent Value  SDOH Interventions   Housing Interventions Intervention Not Indicated  Transportation Interventions Intervention Not Indicated        Care Coordination Interventions:  Yes, provided   Follow up plan: No further intervention required.   Encounter Outcome:  Pt. Visit Completed   Bary Leriche, RN, MSN [facility] Care Management Care Management Coordinator Direct Line [phone]

## 2022-07-10 NOTE — Patient Instructions (Signed)
Visit Information  Thank you for taking time to visit with me today. Please don't hesitate to contact me if I can be of assistance to you.   Following are the goals we discussed today:   Goals Addressed             This Visit's Progress    COMPLETED: Care Coordination Activities-No follow up required       Care Coordination Interventions: Advised patient to Annual Wellness exam. Discussed [facility] services and support. Assessed SDOH. Advised to discuss with primary care physician if services needed in the future.         If you are experiencing a Mental Health or Behavioral Health Crisis or need someone to talk to, please call the Suicide and Crisis Lifeline: 988   Patient verbalizes understanding of instructions and care plan provided today and agrees to view in MyChart. Active MyChart status and patient understanding of how to access instructions and care plan via MyChart confirmed with patient.     The patient has been provided with contact information for the care management team and has been advised to call with any health related questions or concerns.   Dalayna Lauter J Twana Wileman, RN, MSN [facility] Care Management Care Management Coordinator Direct Line [phone]     

## 2022-07-11 LAB — HCV AB W REFLEX TO QUANT PCR: HCV Ab: NONREACTIVE

## 2022-07-14 DIAGNOSIS — R768 Other specified abnormal immunological findings in serum: Secondary | ICD-10-CM | POA: Insufficient documentation

## 2022-07-14 LAB — ANA: Anti Nuclear Antibody (ANA): POSITIVE — AB

## 2022-07-14 LAB — ANTI-NUCLEAR AB-TITER (ANA TITER): ANA Titer 1: 1:40 {titer} — ABNORMAL HIGH

## 2022-07-14 LAB — CYCLIC CITRUL PEPTIDE ANTIBODY, IGG: Cyclic Citrullin Peptide Ab: <16 UNITS

## 2022-07-14 NOTE — Addendum Note (Signed)
Addended by: Loyola Mast on: 07/14/2022 05:48 PM   Modules accepted: Orders

## 2022-07-15 LAB — HCV INTERPRETATION

## 2022-07-15 LAB — LYME DISEASE SEROLOGY W/REFLEX: Lyme Total Antibody EIA: NEGATIVE

## 2022-07-16 ENCOUNTER — Telehealth: Payer: Self-pay | Admitting: Family Medicine

## 2022-07-16 NOTE — Telephone Encounter (Signed)
Spoke to patient and he was contacted by Rheumatology yesterday and told them  he would call them back due to he wanted to talk to [place] about his labs.   Advised him of Dr Rudds recommendations and he will call the Rheumatologist back to schedule an appointment.  Dm/cma

## 2022-07-16 NOTE — Telephone Encounter (Signed)
Pt would like for you to give him a call so you can better explain his test results that he got back

## 2022-07-19 NOTE — Progress Notes (Unsigned)
Office Visit Note  Patient: Gabriel Roberts             Date of Birth: [birth year]           MRN: [id number]             PCP: Loyola Mast, MD Referring: Loyola Mast, MD Visit Date: 07/20/2022 Occupation: Freight forwarder  Subjective:    History of Present Illness: Gabriel Roberts is a 22 y.o. male here for evaluation of positive ANA checked in association with polyarticular joint pain worsening over the past 2 years.  He has a previous history of regular physical activity active in multiple athletic fields and sustained previous right medial meniscal tear managed nonoperatively.  He has been noticing persistent joint pains going back to around junior year of undergraduate most often bothering him in his knees and in the right shoulder.  Subsequently has had some progressive worsening and also involvement of his left shoulder though not as severely.  More recently for only the past few weeks now he is starting to experience daily pain in his hands as well.  He describes pain as 100% severity in the morning upon waking and this partially improves throughout the day to 60% intensity.  He gets some sensitivity to touch and pressure at the shoulders but not the knees mostly pain with movement.  He does not notice visible swelling.  No rashes or erythema in affected areas.  He does not recall any preceding illness.  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  He does not take any medications or supplements.  Laboratory workup with PCP office showed a low positive ANA 1: 40 titer otherwise negative for serum inflammatory markers negative for Lyme disease and CCP antibody.  He did have mildly low vitamin D level recommended to start supplementing this. He has not experienced any new hair loss or thinning or photosensitive skin rashes.  Denies Raynaud's symptoms.  No abnormal bleeding or blood  clots. Has some seasonal allergy symptoms with mild conjunctivitis no problem with oral nasal ulcers.  Does not know of any particular family history of arthritis or inflammatory or autoimmune conditions.   Labs reviewed 07/2021 ANA 1:40 fine speckled CCP neg ESR 4 CRP <1 Vit D 25.88 Lyme neg   Activities of Daily Living:  Patient reports morning stiffness for all day. Patient Reports nocturnal pain.  Difficulty dressing/grooming: Denies Difficulty climbing stairs: Denies Difficulty getting out of chair: Denies Difficulty using hands for taps, buttons, cutlery, and/or writing: Denies  Review of Systems  Constitutional:  Negative for fatigue.  HENT:  Positive for mouth sores. Negative for mouth dryness.   Eyes:  Negative for dryness.  Respiratory:  Negative for shortness of breath.   Cardiovascular:  Negative for chest pain and palpitations.  Gastrointestinal:  Negative for blood in stool, constipation and diarrhea.  Endocrine: Negative for increased urination.  Genitourinary:  Negative for involuntary urination.  Musculoskeletal:  Positive for joint pain, joint pain, myalgias, morning stiffness and myalgias. Negative for gait problem, joint swelling, muscle weakness and muscle tenderness.  Skin:  Negative for color change, rash, hair loss and sensitivity to sunlight.  Allergic/Immunologic: Negative for susceptible to infections.  Neurological:  Negative for dizziness and headaches.  Hematological:  Negative for swollen glands.  Psychiatric/Behavioral:  Negative for depressed mood and sleep disturbance. The patient is nervous/anxious.  PMFS History:  Patient Active Problem List   Diagnosis Date Noted   Polyarthralgia 07/20/2022   Positive ANA (antinuclear antibody) 07/14/2022   Right knee meniscal tear 05/16/2021   Underweight 11/04/2016   Non-intractable cyclical vomiting with nausea 11/04/2016    Past Medical History:  Diagnosis Date   Allergy    Seasonal    Anxiety    with test-taking only   History of ADHD    Sleep walking     Family History  Problem Relation Age of Onset   Healthy Mother        Living   Diabetes Father        Living   Hyperlipidemia Father    Healthy Sister        x2   Healthy Sister    Asthma Paternal Aunt    Cancer Maternal Grandfather    COPD Paternal Grandfather    Hypertension Paternal Grandfather    Hyperlipidemia Paternal Grandfather    Past Surgical History:  Procedure Laterality Date   NO PAST SURGERIES     Social History   Social History Narrative   Emergency planning/management officer.    Immunization History  Administered Date(s) Administered   DTaP 06/05/1999, 08/22/1999, 10/23/1999, 10/13/2000, 10/30/2004   HIB (PRP-OMP) 06/05/1999, 08/22/1999, 10/23/1999, 10/13/2000   HPV 9-valent 11/04/2016   Hepatitis A 03/11/2007, 01/26/2008   Hepatitis B 01-29-1999, 05/07/1999, 01/15/2000   IPV 06/05/1999, 07/24/1999, 08/22/1999, 10/30/2004   Influenza-Unspecified 02/01/2007, 12/26/2009   MMR 05/11/2000, 10/30/2004   Meningococcal Mcv4o 11/04/2016   Pneumococcal Conjugate-13 06/05/1999, 08/22/1999, 02/12/2000, 10/13/2000   Td 07/10/2022   Tdap 08/28/2010   Varicella 10/13/2000, 03/11/2007     Objective: Vital Signs: BP 111/71 (BP Location: Right Arm, Patient Position: Sitting, Cuff Size: Normal)   Pulse 69   Resp 16   Ht 5\' 9"  (1.753 m)   Wt 127 lb 3.2 oz (57.7 kg)   BMI 18.78 kg/m    Physical Exam Eyes:     Conjunctiva/sclera: Conjunctivae normal.  Cardiovascular:     Rate and Rhythm: Normal rate and regular rhythm.  Pulmonary:     Effort: Pulmonary effort is normal.     Breath sounds: Normal breath sounds.  Musculoskeletal:        General: Swelling present.     Right lower leg: No edema.     Left lower leg: No edema.  Lymphadenopathy:     Cervical: No cervical adenopathy.  Skin:    General: Skin is warm and dry.     Findings: No rash.  Neurological:     Mental Status: He is alert.  Psychiatric:         Mood and Affect: Mood normal.      Musculoskeletal Exam:  Shoulders full ROM, right shoulder pain to palpation on anterior and lateral sides, no palpable effusion, painful range of motion with overhead abduction and with external rotation, left shoulder nontender to pressure but some pain with overhead abduction Tenderness to pressure over trapezius muscles bilaterally with no radiation Elbows full ROM no tenderness or swelling Wrists full ROM no tenderness or swelling Fingers full ROM no tenderness or swelling Hip normal internal and external rotation without pain, no tenderness to lateral hip palpation Knees full ROM, some pain with full flexion extension but not tender to pressure, no palpable effusion Ankles full ROM no tenderness or swelling MTPs full ROM no tenderness or swelling  Limited ultrasound exam of the right shoulder and right knee visible changes consistent with prior medial meniscus  tear otherwise unremarkable  Investigation: No additional findings.  Imaging: No results found.  Recent Labs: Lab Results  Component Value Date   WBC 6.8 07/10/2022   HGB 14.9 07/10/2022   PLT 312.0 07/10/2022   NA 140 07/10/2022   K 4.4 07/10/2022   CL 104 07/10/2022   CO2 27 07/10/2022   GLUCOSE 98 07/10/2022   BUN 14 07/10/2022   CREATININE 1.10 07/10/2022   BILITOT 0.5 07/10/2022   ALKPHOS 109 07/10/2022   AST 12 07/10/2022   ALT 9 07/10/2022   PROT 7.4 07/10/2022   ALBUMIN 5.0 07/10/2022   CALCIUM 9.9 07/10/2022    Speciality Comments: No specialty comments available.  Procedures:  No procedures performed Allergies: Cat hair extract   Assessment / Plan:     Visit Diagnoses: Positive ANA (antinuclear antibody) - Plan: Rheumatoid factor, Mutated Citrullinated Vimentin (MCV) Antibody, RNP Antibody, Anti-Smith antibody, Anti-DNA antibody, double-stranded, C3 and C4, Sjogrens syndrome-A extractable nuclear antibody, Sjogrens syndrome-B extractable nuclear  antibody  Very low positive ANA titer with nonspecific clinical findings but I do not see a clear explanation for his current polyarticular joint pain.  Will check antibody panel as above today.  Also serum complements and rheumatoid factor and MCV antibodies.  Lower pretest disease suspicion though so would not recommend empiric trial of DMARD unless there clearly positive findings.  Polyarthralgia  Joint pain of multiple areas pretty nonspecific on exam the overall seems more to be enthesopathy or tendinopathy versus intra-articular pathology in several of these areas.  Joint pain maximal severity in the mornings with partial improvement during activity is consistent with inflammatory arthritis but nonspecific.  He has no back pain or hip pain complaint to suggest spondyloarthropathy.  If laboratory workup comes back negative would recommend trial of scheduled NSAIDs as next step.  Orders: Orders Placed This Encounter  Procedures   Rheumatoid factor   Mutated Citrullinated Vimentin (MCV) Antibody   RNP Antibody   Anti-Smith antibody   Anti-DNA antibody, double-stranded   C3 and C4   Sjogrens syndrome-A extractable nuclear antibody   Sjogrens syndrome-B extractable nuclear antibody   No orders of the defined types were placed in this encounter.    Follow-Up Instructions: No follow-ups on file.   Fuller Plan, MD  Note - This record has been created using [employer].  Chart creation errors have been sought, but may not always  have been located. Such creation errors do not reflect on  the standard of medical care.

## 2022-07-20 ENCOUNTER — Encounter: Payer: Self-pay | Admitting: Internal Medicine

## 2022-07-20 ENCOUNTER — Ambulatory Visit: Payer: BC Managed Care – PPO | Attending: Internal Medicine | Admitting: Internal Medicine

## 2022-07-20 VITALS — BP 111/71 | HR 69 | Resp 16 | Ht 69.0 in | Wt 127.2 lb

## 2022-07-20 DIAGNOSIS — R768 Other specified abnormal immunological findings in serum: Secondary | ICD-10-CM

## 2022-07-20 DIAGNOSIS — M2550 Pain in unspecified joint: Secondary | ICD-10-CM | POA: Diagnosis not present

## 2022-07-23 LAB — ANTI-DNA ANTIBODY, DOUBLE-STRANDED: ds DNA Ab: <1 IU/mL

## 2022-07-23 LAB — SJOGRENS SYNDROME-A EXTRACTABLE NUCLEAR ANTIBODY: SSA (Ro) (ENA) Antibody, IgG: <1 AI

## 2022-07-23 LAB — C3 AND C4
C3 Complement: 156 mg/dL (ref 82–185)
C4 Complement: 19 mg/dL (ref 15–53)

## 2022-07-23 LAB — ANTI-SMITH ANTIBODY: ENA SM Ab Ser-aCnc: <1 AI

## 2022-07-23 LAB — RNP ANTIBODY: Ribonucleic Protein(ENA) Antibody, IgG: <1 AI

## 2022-07-23 LAB — MUTATED CITRULLINATED VIMENTIN (MCV) ANTIBODY: MUTATED CITRULLINATED VIMENTIN (MCV) AB: <20 U/mL (ref <20)

## 2022-07-23 LAB — RHEUMATOID FACTOR: Rheumatoid fact SerPl-aCnc: <10 IU/mL (ref <14)

## 2022-07-23 LAB — SJOGRENS SYNDROME-B EXTRACTABLE NUCLEAR ANTIBODY: SSB (La) (ENA) Antibody, IgG: <1 AI

## 2022-07-27 MED ORDER — MELOXICAM 15 MG PO TABS
15.0000 mg | ORAL_TABLET | Freq: Every day | ORAL | 0 refills | Status: DC
Start: 2022-07-27 — End: 2022-09-14

## 2022-07-27 NOTE — Addendum Note (Signed)
Addended by: Fuller Plan on: 07/27/2022 03:15 AM   Modules accepted: Orders

## 2022-07-27 NOTE — Progress Notes (Signed)
Lab results are all negative for rheumatoid arthritis or anything related to the positive ANA test. I recommend trying the meloxicam once daily. He should take this with food and can decrease frequency if he has any stomach issues on it. He should not take any OTC ibuprofen or aleve at the same time.

## 2022-09-13 NOTE — Progress Notes (Unsigned)
Office Visit Note  Patient: Gabriel Roberts             Date of Birth: [birth year]           MRN: [id number]             PCP: Loyola Mast, MD Referring: Loyola Mast, MD Visit Date: 09/14/2022   Subjective:  No chief complaint on file.   History of Present Illness: Gabriel Roberts is a 22 y.o. male here for follow up ***   Previous HPI 07/20/22  Gabriel Roberts is a 22 y.o. male here for evaluation of positive ANA checked in association with polyarticular joint pain worsening over the past 2 years.  He has a previous history of regular physical activity active in multiple athletic fields and sustained previous right medial meniscal tear managed nonoperatively.  He has been noticing persistent joint pains going back to around junior year of undergraduate most often bothering him in his knees and in the right shoulder.  Subsequently has had some progressive worsening and also involvement of his left shoulder though not as severely.  More recently for only the past few weeks now he is starting to experience daily pain in his hands as well.  He describes pain as 100% severity in the morning upon waking and this partially improves throughout the day to 60% intensity.  He gets some sensitivity to touch and pressure at the shoulders but not the knees mostly pain with movement.  He does not notice visible swelling.  No rashes or erythema in affected areas.  He does not recall any preceding illness.  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  He does not take any medications or supplements.  Laboratory workup with PCP office showed a low positive ANA 1: 40 titer otherwise negative for serum inflammatory markers negative for Lyme disease and CCP antibody.  He did have mildly low vitamin D level recommended to start supplementing this. He has not experienced any new hair loss or thinning or  photosensitive skin rashes.  Denies Raynaud's symptoms.  No abnormal bleeding or blood clots. Has some seasonal allergy symptoms with mild conjunctivitis no problem with oral nasal ulcers.  Does not know of any particular family history of arthritis or inflammatory or autoimmune conditions.     Labs reviewed 07/2021 ANA 1:40 fine speckled CCP neg ESR 4 CRP <1 Vit D 25.88 Lyme neg   No Rheumatology ROS completed.   PMFS History:  Patient Active Problem List   Diagnosis Date Noted   Polyarthralgia 07/20/2022   Positive ANA (antinuclear antibody) 07/14/2022   Right knee meniscal tear 05/16/2021   Underweight 11/04/2016   Non-intractable cyclical vomiting with nausea 11/04/2016    Past Medical History:  Diagnosis Date   Allergy    Seasonal   Anxiety    with test-taking only   History of ADHD    Sleep walking     Family History  Problem Relation Age of Onset   Healthy Mother        Living   Diabetes Father        Living   Hyperlipidemia Father    Healthy Sister        x2   Healthy Sister    Asthma Paternal Aunt    Cancer Maternal Grandfather    COPD Paternal Grandfather    Hypertension  Paternal Grandfather    Hyperlipidemia Paternal Grandfather    Past Surgical History:  Procedure Laterality Date   NO PAST SURGERIES     Social History   Social History Narrative   Emergency planning/management officer.    Immunization History  Administered Date(s) Administered   DTaP 06/05/1999, 08/22/1999, 10/23/1999, 10/13/2000, 10/30/2004   HIB (PRP-OMP) 06/05/1999, 08/22/1999, 10/23/1999, 10/13/2000   HPV 9-valent 11/04/2016   Hepatitis A 03/11/2007, 01/26/2008   Hepatitis B October 17, 1998, 05/07/1999, 01/15/2000   IPV 06/05/1999, 07/24/1999, 08/22/1999, 10/30/2004   Influenza-Unspecified 02/01/2007, 12/26/2009   MMR 05/11/2000, 10/30/2004   Meningococcal Mcv4o 11/04/2016   Pneumococcal Conjugate-13 06/05/1999, 08/22/1999, 02/12/2000, 10/13/2000   Td 07/10/2022   Tdap 08/28/2010   Varicella  10/13/2000, 03/11/2007     Objective: Vital Signs: There were no vitals taken for this visit.   Physical Exam   Musculoskeletal Exam: ***  CDAI Exam: CDAI Score: -- Patient Global: --; Provider Global: -- Swollen: --; Tender: -- Joint Exam 09/14/2022   No joint exam has been documented for this visit   There is currently no information documented on the homunculus. Go to the Rheumatology activity and complete the homunculus joint exam.  Investigation: No additional findings.  Imaging: No results found.  Recent Labs: Lab Results  Component Value Date   WBC 6.8 07/10/2022   HGB 14.9 07/10/2022   PLT 312.0 07/10/2022   NA 140 07/10/2022   K 4.4 07/10/2022   CL 104 07/10/2022   CO2 27 07/10/2022   GLUCOSE 98 07/10/2022   BUN 14 07/10/2022   CREATININE 1.10 07/10/2022   BILITOT 0.5 07/10/2022   ALKPHOS 109 07/10/2022   AST 12 07/10/2022   ALT 9 07/10/2022   PROT 7.4 07/10/2022   ALBUMIN 5.0 07/10/2022   CALCIUM 9.9 07/10/2022    Speciality Comments: No specialty comments available.  Procedures:  No procedures performed Allergies: Cat hair extract   Assessment / Plan:     Visit Diagnoses: No diagnosis found.  ***  Orders: No orders of the defined types were placed in this encounter.  No orders of the defined types were placed in this encounter.    Follow-Up Instructions: No follow-ups on file.   Fuller Plan, MD  Note - This record has been created using [employer].  Chart creation errors have been sought, but may not always  have been located. Such creation errors do not reflect on  the standard of medical care.

## 2022-09-14 ENCOUNTER — Ambulatory Visit: Payer: BC Managed Care – PPO | Attending: Internal Medicine | Admitting: Internal Medicine

## 2022-09-14 ENCOUNTER — Encounter: Payer: Self-pay | Admitting: Internal Medicine

## 2022-09-14 VITALS — BP 101/63 | HR 71 | Resp 12 | Ht 69.0 in | Wt 124.0 lb

## 2022-09-14 DIAGNOSIS — R768 Other specified abnormal immunological findings in serum: Secondary | ICD-10-CM

## 2022-09-14 DIAGNOSIS — M2550 Pain in unspecified joint: Secondary | ICD-10-CM

## 2022-09-14 MED ORDER — MELOXICAM 15 MG PO TABS
15.0000 mg | ORAL_TABLET | Freq: Every day | ORAL | 0 refills | Status: AC
Start: 2022-09-14 — End: ?

## 2022-11-18 ENCOUNTER — Encounter: Payer: BC Managed Care – PPO | Admitting: Internal Medicine
